# Patient Record
Sex: Male | Born: 2000 | Race: White | Hispanic: No | Marital: Single | Smoking: Never smoker
Health system: Southern US, Community
[De-identification: ages and names within clinical notes are randomized; demographics above are authoritative.]

---

## 2020-02-12 ENCOUNTER — Other Ambulatory Visit: Payer: Self-pay

## 2020-02-12 ENCOUNTER — Emergency Department (HOSPITAL_COMMUNITY): Payer: Medicaid Other | Admitting: Anesthesiology

## 2020-02-12 ENCOUNTER — Ambulatory Visit (HOSPITAL_COMMUNITY)
Admission: EM | Admit: 2020-02-12 | Discharge: 2020-02-12 | Disposition: A | Discharge status: Home / Self Care | Payer: Medicaid Other | Attending: Emergency Medicine | Admitting: Emergency Medicine

## 2020-02-12 ENCOUNTER — Emergency Department (HOSPITAL_COMMUNITY): Payer: Medicaid Other

## 2020-02-12 ENCOUNTER — Encounter (HOSPITAL_COMMUNITY)
Admission: EM | Disposition: A | Discharge status: Home / Self Care | Payer: Self-pay | Source: Home / Self Care | Attending: Emergency Medicine

## 2020-02-12 ENCOUNTER — Encounter (HOSPITAL_COMMUNITY): Payer: Self-pay | Admitting: Emergency Medicine

## 2020-02-12 DIAGNOSIS — K3580 Unspecified acute appendicitis: Secondary | ICD-10-CM | POA: Insufficient documentation

## 2020-02-12 DIAGNOSIS — F17290 Nicotine dependence, other tobacco product, uncomplicated: Secondary | ICD-10-CM | POA: Diagnosis not present

## 2020-02-12 DIAGNOSIS — Z20822 Contact with and (suspected) exposure to covid-19: Secondary | ICD-10-CM | POA: Insufficient documentation

## 2020-02-12 DIAGNOSIS — F1290 Cannabis use, unspecified, uncomplicated: Secondary | ICD-10-CM | POA: Diagnosis not present

## 2020-02-12 HISTORY — PX: LAPAROSCOPIC APPENDECTOMY: SHX408

## 2020-02-12 LAB — COMPREHENSIVE METABOLIC PANEL
ALT: 17 U/L (ref 0–44)
AST: 19 U/L (ref 15–41)
Albumin: 4.5 g/dL (ref 3.5–5.0)
Alkaline Phosphatase: 52 U/L (ref 38–126)
Anion gap: 10 (ref 5–15)
BUN: 19 mg/dL (ref 6–20)
CO2: 25 mmol/L (ref 22–32)
Calcium: 9.1 mg/dL (ref 8.9–10.3)
Chloride: 102 mmol/L (ref 98–111)
Creatinine, Ser: 1.27 mg/dL — ABNORMAL HIGH (ref 0.61–1.24)
GFR calc Af Amer: >60 mL/min (ref >60)
GFR calc non Af Amer: >60 mL/min (ref >60)
Glucose, Bld: 139 mg/dL — ABNORMAL HIGH (ref 70–99)
Potassium: 3.6 mmol/L (ref 3.5–5.1)
Sodium: 137 mmol/L (ref 135–145)
Total Bilirubin: 0.4 mg/dL (ref 0.3–1.2)
Total Protein: 6.6 g/dL (ref 6.5–8.1)

## 2020-02-12 LAB — CBC
HCT: 43 % (ref 39.0–52.0)
Hemoglobin: 14.4 g/dL (ref 13.0–17.0)
MCH: 32.3 pg (ref 26.0–34.0)
MCHC: 33.5 g/dL (ref 30.0–36.0)
MCV: 96.4 fL (ref 80.0–100.0)
Platelets: 168 10*3/uL (ref 150–400)
RBC: 4.46 MIL/uL (ref 4.22–5.81)
RDW: 11.6 % (ref 11.5–15.5)
WBC: 12.3 10*3/uL — ABNORMAL HIGH (ref 4.0–10.5)
nRBC: 0 % (ref 0.0–0.2)

## 2020-02-12 LAB — RESPIRATORY PANEL BY RT PCR (FLU A&B, COVID)
Influenza A by PCR: NEGATIVE
Influenza B by PCR: NEGATIVE
SARS Coronavirus 2 by RT PCR: NEGATIVE

## 2020-02-12 LAB — URINALYSIS, ROUTINE W REFLEX MICROSCOPIC
Bilirubin Urine: NEGATIVE
Glucose, UA: NEGATIVE mg/dL
Hgb urine dipstick: NEGATIVE
Ketones, ur: NEGATIVE mg/dL
Leukocytes,Ua: NEGATIVE
Nitrite: NEGATIVE
Protein, ur: NEGATIVE mg/dL
Specific Gravity, Urine: 1.015 (ref 1.005–1.030)
pH: 7 (ref 5.0–8.0)

## 2020-02-12 LAB — LIPASE, BLOOD: Lipase: 25 U/L (ref 11–51)

## 2020-02-12 SURGERY — APPENDECTOMY, LAPAROSCOPIC
Anesthesia: General | Site: Abdomen

## 2020-02-12 MED ORDER — IBUPROFEN 200 MG PO TABS: 400.0000 mg | ORAL_TABLET | Freq: Three times a day (TID) | ORAL | Status: AC | PRN

## 2020-02-12 MED ORDER — LIDOCAINE 2% (20 MG/ML) 5 ML SYRINGE
INTRAMUSCULAR | Status: AC
  Filled 2020-02-12: qty 10

## 2020-02-12 MED ORDER — OXYCODONE HCL 5 MG PO TABS: 5.0000 mg | ORAL_TABLET | Freq: Once | ORAL | Status: DC | PRN

## 2020-02-12 MED ORDER — ROCURONIUM BROMIDE 10 MG/ML (PF) SYRINGE
PREFILLED_SYRINGE | INTRAVENOUS | Status: DC | PRN
  Administered 2020-02-12: 20 mg via INTRAVENOUS
  Administered 2020-02-12: 30 mg via INTRAVENOUS

## 2020-02-12 MED ORDER — AMISULPRIDE (ANTIEMETIC) 5 MG/2ML IV SOLN
INTRAVENOUS | Status: AC
  Filled 2020-02-12: qty 4

## 2020-02-12 MED ORDER — ACETAMINOPHEN 500 MG PO TABS: 1000.0000 mg | ORAL_TABLET | Freq: Four times a day (QID) | ORAL | 0 refills | Status: AC

## 2020-02-12 MED ORDER — SODIUM CHLORIDE 0.9 % IR SOLN
Status: DC | PRN
  Administered 2020-02-12: 1000 mL

## 2020-02-12 MED ORDER — MIDAZOLAM HCL 2 MG/2ML IJ SOLN
INTRAMUSCULAR | Status: AC
  Filled 2020-02-12: qty 2

## 2020-02-12 MED ORDER — LIDOCAINE 2% (20 MG/ML) 5 ML SYRINGE
INTRAMUSCULAR | Status: DC | PRN
  Administered 2020-02-12: 60 mg via INTRAVENOUS

## 2020-02-12 MED ORDER — FENTANYL CITRATE (PF) 250 MCG/5ML IJ SOLN
INTRAMUSCULAR | Status: DC | PRN
Start: 2020-02-12 — End: 2020-02-12
  Administered 2020-02-12: 100 ug via INTRAVENOUS
  Administered 2020-02-12: 50 ug via INTRAVENOUS

## 2020-02-12 MED ORDER — IOHEXOL 300 MG/ML  SOLN
100.0000 mL | Freq: Once | INTRAMUSCULAR | Status: AC | PRN
  Administered 2020-02-12: 100 mL via INTRAVENOUS

## 2020-02-12 MED ORDER — PROPOFOL 10 MG/ML IV BOLUS
INTRAVENOUS | Status: AC
  Filled 2020-02-12: qty 20

## 2020-02-12 MED ORDER — FENTANYL CITRATE (PF) 100 MCG/2ML IJ SOLN: 25.0000 ug | INTRAMUSCULAR | Status: DC | PRN

## 2020-02-12 MED ORDER — METRONIDAZOLE IN NACL 5-0.79 MG/ML-% IV SOLN
500.0000 mg | Freq: Four times a day (QID) | INTRAVENOUS | Status: DC
  Administered 2020-02-12: 500 mg via INTRAVENOUS
  Filled 2020-02-12: qty 100

## 2020-02-12 MED ORDER — OXYCODONE HCL 5 MG PO TABS: 5.0000 mg | ORAL_TABLET | Freq: Four times a day (QID) | ORAL | 0 refills | Status: AC | PRN

## 2020-02-12 MED ORDER — PROMETHAZINE HCL 25 MG/ML IJ SOLN: 6.2500 mg | INTRAMUSCULAR | Status: DC | PRN

## 2020-02-12 MED ORDER — AMISULPRIDE (ANTIEMETIC) 5 MG/2ML IV SOLN
10.0000 mg | Freq: Once | INTRAVENOUS | Status: AC
  Administered 2020-02-12: 10 mg via INTRAVENOUS

## 2020-02-12 MED ORDER — FENTANYL CITRATE (PF) 250 MCG/5ML IJ SOLN
INTRAMUSCULAR | Status: AC
  Filled 2020-02-12: qty 5

## 2020-02-12 MED ORDER — SODIUM CHLORIDE 0.9 % IV SOLN
2.0000 g | INTRAVENOUS | Status: DC
  Administered 2020-02-12: 2 g via INTRAVENOUS
  Filled 2020-02-12: qty 20

## 2020-02-12 MED ORDER — ROCURONIUM BROMIDE 10 MG/ML (PF) SYRINGE
PREFILLED_SYRINGE | INTRAVENOUS | Status: AC
  Filled 2020-02-12: qty 10

## 2020-02-12 MED ORDER — 0.9 % SODIUM CHLORIDE (POUR BTL) OPTIME
TOPICAL | Status: DC | PRN
  Administered 2020-02-12: 1000 mL

## 2020-02-12 MED ORDER — ACETAMINOPHEN 500 MG PO TABS: 1000.0000 mg | ORAL_TABLET | Freq: Four times a day (QID) | ORAL | Status: DC

## 2020-02-12 MED ORDER — ONDANSETRON HCL 4 MG/2ML IJ SOLN
INTRAMUSCULAR | Status: DC | PRN
  Administered 2020-02-12: 4 mg via INTRAVENOUS

## 2020-02-12 MED ORDER — DEXMEDETOMIDINE (PRECEDEX) IN NS 20 MCG/5ML (4 MCG/ML) IV SYRINGE
PREFILLED_SYRINGE | INTRAVENOUS | Status: AC
  Filled 2020-02-12: qty 5

## 2020-02-12 MED ORDER — DEXAMETHASONE SODIUM PHOSPHATE 10 MG/ML IJ SOLN
INTRAMUSCULAR | Status: DC | PRN
  Administered 2020-02-12: 8 mg via INTRAVENOUS

## 2020-02-12 MED ORDER — LACTATED RINGERS IV SOLN: INTRAVENOUS | Status: DC

## 2020-02-12 MED ORDER — PHENYLEPHRINE 40 MCG/ML (10ML) SYRINGE FOR IV PUSH (FOR BLOOD PRESSURE SUPPORT)
PREFILLED_SYRINGE | INTRAVENOUS | Status: AC
  Filled 2020-02-12: qty 10

## 2020-02-12 MED ORDER — OXYCODONE HCL 5 MG/5ML PO SOLN: 5.0000 mg | Freq: Once | ORAL | Status: DC | PRN

## 2020-02-12 MED ORDER — MIDAZOLAM HCL 5 MG/5ML IJ SOLN
INTRAMUSCULAR | Status: DC | PRN
  Administered 2020-02-12: 2 mg via INTRAVENOUS

## 2020-02-12 MED ORDER — ONDANSETRON HCL 4 MG PO TABS: 4.0000 mg | ORAL_TABLET | Freq: Three times a day (TID) | ORAL | 0 refills | Status: AC | PRN

## 2020-02-12 MED ORDER — SUCCINYLCHOLINE CHLORIDE 20 MG/ML IJ SOLN
INTRAMUSCULAR | Status: DC | PRN
  Administered 2020-02-12: 120 mg via INTRAVENOUS

## 2020-02-12 MED ORDER — BUPIVACAINE HCL (PF) 0.25 % IJ SOLN
INTRAMUSCULAR | Status: AC
  Filled 2020-02-12: qty 30

## 2020-02-12 MED ORDER — BUPIVACAINE HCL (PF) 0.25 % IJ SOLN
INTRAMUSCULAR | Status: DC | PRN
  Administered 2020-02-12: 20 mL

## 2020-02-12 MED ORDER — KETOROLAC TROMETHAMINE 15 MG/ML IJ SOLN: 15.0000 mg | Freq: Once | INTRAMUSCULAR | Status: DC

## 2020-02-12 MED ORDER — SUGAMMADEX SODIUM 200 MG/2ML IV SOLN
INTRAVENOUS | Status: DC | PRN
  Administered 2020-02-12: 200 mg via INTRAVENOUS

## 2020-02-12 SURGICAL SUPPLY — 37 items
APPLIER CLIP 5 13 M/L LIGAMAX5 (MISCELLANEOUS)
APPLIER CLIP ROT 10 11.4 M/L (STAPLE)
CANISTER SUCT 3000ML PPV (MISCELLANEOUS) ×3 IMPLANT
CHLORAPREP W/TINT 26 (MISCELLANEOUS) ×3 IMPLANT
CLIP APPLIE 5 13 M/L LIGAMAX5 (MISCELLANEOUS) IMPLANT
CLIP APPLIE ROT 10 11.4 M/L (STAPLE) IMPLANT
COVER SURGICAL LIGHT HANDLE (MISCELLANEOUS) ×3 IMPLANT
COVER WAND RF STERILE (DRAPES) IMPLANT
CUTTER FLEX LINEAR 45M (STAPLE) ×3 IMPLANT
DERMABOND ADVANCED (GAUZE/BANDAGES/DRESSINGS) ×2
DERMABOND ADVANCED .7 DNX12 (GAUZE/BANDAGES/DRESSINGS) ×1 IMPLANT
ELECT REM PT RETURN 9FT ADLT (ELECTROSURGICAL) ×3
ELECTRODE REM PT RTRN 9FT ADLT (ELECTROSURGICAL) ×1 IMPLANT
GLOVE SURG SIGNA 7.5 PF LTX (GLOVE) ×3 IMPLANT
GOWN STRL REUS W/ TWL LRG LVL3 (GOWN DISPOSABLE) ×2 IMPLANT
GOWN STRL REUS W/ TWL XL LVL3 (GOWN DISPOSABLE) ×1 IMPLANT
GOWN STRL REUS W/TWL LRG LVL3 (GOWN DISPOSABLE) ×4
GOWN STRL REUS W/TWL XL LVL3 (GOWN DISPOSABLE) ×2
KIT BASIN OR (CUSTOM PROCEDURE TRAY) ×3 IMPLANT
KIT TURNOVER KIT B (KITS) ×3 IMPLANT
NS IRRIG 1000ML POUR BTL (IV SOLUTION) ×3 IMPLANT
PAD ARMBOARD 7.5X6 YLW CONV (MISCELLANEOUS) ×6 IMPLANT
POUCH SPECIMEN RETRIEVAL 10MM (ENDOMECHANICALS) ×3 IMPLANT
RELOAD 45 VASCULAR/THIN (ENDOMECHANICALS) IMPLANT
RELOAD STAPLE TA45 3.5 REG BLU (ENDOMECHANICALS) ×3 IMPLANT
SET IRRIG TUBING LAPAROSCOPIC (IRRIGATION / IRRIGATOR) ×3 IMPLANT
SET TUBE SMOKE EVAC HIGH FLOW (TUBING) ×3 IMPLANT
SHEARS HARMONIC ACE PLUS 36CM (ENDOMECHANICALS) ×3 IMPLANT
SLEEVE ENDOPATH XCEL 5M (ENDOMECHANICALS) ×3 IMPLANT
SPECIMEN JAR SMALL (MISCELLANEOUS) ×3 IMPLANT
SUT MON AB 4-0 PC3 18 (SUTURE) ×3 IMPLANT
TOWEL GREEN STERILE (TOWEL DISPOSABLE) ×3 IMPLANT
TOWEL GREEN STERILE FF (TOWEL DISPOSABLE) ×3 IMPLANT
TRAY LAPAROSCOPIC MC (CUSTOM PROCEDURE TRAY) ×3 IMPLANT
TROCAR XCEL BLUNT TIP 100MML (ENDOMECHANICALS) ×3 IMPLANT
TROCAR XCEL NON-BLD 5MMX100MML (ENDOMECHANICALS) ×3 IMPLANT
WATER STERILE IRR 1000ML POUR (IV SOLUTION) ×3 IMPLANT

## 2020-02-12 NOTE — ED Provider Notes (Signed)
[facility] EMERGENCY DEPARTMENT Provider Note   CSN: [id number] Arrival date & time: 02/12/20  0310     History Chief Complaint  Patient presents with  . Abdominal Pain    Corey Mcdowell is a 19 y.o. male with no known past medical history presents to the ER for evaluation of abdominal pain.  This began at around 1 AM this morning.  The pain initially was in the middle lower abdomen below the bellybutton but since has localized to the right lower quadrant.  He had subjective fevers and nausea this morning but this has resolved.  States when the pain began and on the drive to the ER the pain was much worse but it has slightly improved.  He took over-the-counter zinc.  He thought he had a little throat discomfort as well but this has resolved.  At first thought it was food poisoning. States he was talking to a friend who just had appendicitis and he is concerned about this.  He called his father who is a physician and told him to come to the ER.  He denies abdominal surgeries.  Denies history of kidney stones.  No back or flank pain.  No urinary symptoms.  No vomiting, diarrhea, constipation.  HPI     History reviewed. No pertinent past medical history.  There are no problems to display for this patient.   History reviewed. No pertinent surgical history.     No family history on file.  Social History   Tobacco Use  . Smoking status: Never Smoker  . Smokeless tobacco: Never Used  Substance Use Topics  . Alcohol use: Never  . Drug use: Never    Home Medications Prior to Admission medications   Not on File    Allergies    Patient has no known allergies.  Review of Systems   Review of Systems  Gastrointestinal: Positive for abdominal pain.  All other systems reviewed and are negative.   Physical Exam Updated Vital Signs BP 128/62   Pulse 66   Temp 98.2 F (36.8 C)   Resp 17   Ht 5\' 8"  (1.727 m)   Wt 80 kg   SpO2 96%   BMI 26.82 kg/m    Physical Exam Vitals and nursing note reviewed.  Constitutional:      Appearance: He is well-developed.     Comments: Non toxic.  HENT:     Head: Normocephalic and atraumatic.     Nose: Nose normal.  Eyes:     Conjunctiva/sclera: Conjunctivae normal.  Cardiovascular:     Rate and Rhythm: Normal rate and regular rhythm.     Heart sounds: Normal heart sounds.  Pulmonary:     Effort: Pulmonary effort is normal.     Breath sounds: Normal breath sounds.  Abdominal:     General: Bowel sounds are normal.     Palpations: Abdomen is soft.     Tenderness: There is abdominal tenderness in the right lower quadrant. Positive signs include McBurney's sign.     Comments: No G/R/R. No suprapubic or CVA tenderness. Negative Murphy's.   Musculoskeletal:        General: Normal range of motion.     Cervical back: Normal range of motion.  Skin:    General: Skin is warm and dry.     Capillary Refill: Capillary refill takes less than 2 seconds.  Neurological:     Mental Status: He is alert.  Psychiatric:        Behavior:  Behavior normal.     ED Results / Procedures / Treatments   Labs (all labs ordered are listed, but only abnormal results are displayed) Labs Reviewed  COMPREHENSIVE METABOLIC PANEL - Abnormal; Notable for the following components:      Result Value   Glucose, Bld 139 (*)    Creatinine, Ser 1.27 (*)    All other components within normal limits  CBC - Abnormal; Notable for the following components:   WBC 12.3 (*)    All other components within normal limits  RESPIRATORY PANEL BY RT PCR (FLU A&B, COVID)  LIPASE, BLOOD  URINALYSIS, ROUTINE W REFLEX MICROSCOPIC    EKG None  Radiology CT ABDOMEN PELVIS W CONTRAST  Result Date: 02/12/2020 CLINICAL DATA:  19 year old male with history of right lower quadrant abdominal pain EXAM: CT ABDOMEN AND PELVIS WITH CONTRAST TECHNIQUE: Multidetector CT imaging of the abdomen and pelvis was performed using the standard protocol  following bolus administration of intravenous contrast. CONTRAST:  OMNIPAQUE IOHEXOL 300 MG/ML  SOLN COMPARISON:  None. FINDINGS: Lower chest: No acute abnormality. Hepatobiliary: No focal liver abnormality is seen. No gallstones, gallbladder wall thickening, or biliary dilatation. Pancreas: Unremarkable. No pancreatic ductal dilatation or surrounding inflammatory changes. Spleen: Normal in size without focal abnormality. Adrenals/Urinary Tract: Adrenal glands are unremarkable. Kidneys are normal, without renal calculi, focal lesion, or hydronephrosis. Bladder is unremarkable. Stomach/Bowel: Stomach and small bowel are within normal limits. There is a dilated tubular structure in the right lower quadrant adjacent to the cecum which appears to be a fluid-filled distended appendix measuring up to 1 cm in diameter (best visualized on coronal images, series 6, image 66). No evidence of surrounding fluid collection or cecal inflammatory changes. Moderate stool burden within the colon. Vascular/Lymphatic: There are few prominent right lower quadrant lymph nodes, for example along the right iliac chain measuring up to 9 mm in short axis (series 3, image 53). No abdominopelvic vascular abnormality. Reproductive: Prostate is unremarkable. Other: No abdominal wall hernia or abnormality. No abdominopelvic ascites. Musculoskeletal: Osseous fragment along anteromedial aspect of the left lesser trochanter which is asymmetrically prominent compared to the right as could be seen with enthesopathy or prior psoas injury. There is a right intertrochanteric bone island. No acute osseous abnormality. IMPRESSION: Distended tubular structure in the right lower quadrant, favored to represent the appendix, however evaluation is limited due to lack of enteric contrast and body habitus. There is associated right lower quadrant iliac lymphadenopathy which is presumed reactive. These findings are highly suspicious for acute appendicitis.  If clinically indicated, consider ultrasound of the right lower quadrant for possible further characterization. These results were called by telephone at the time of interpretation on 02/12/2020 at 11:53 am to provider Clarke Amburn, PA-C and Dr. Lockie Mola, Who verbally acknowledged these results. Electronically Signed   By: Marliss Coots MD   On: 02/12/2020 11:59    Procedures Procedures (including critical care time)  Medications Ordered in ED Medications  iohexol (OMNIPAQUE) 300 MG/ML solution 100 mL (100 mLs Intravenous Contrast Given 02/12/20 1118)    ED Course  I have reviewed the triage vital signs and the nursing notes.  Pertinent labs & imaging results that were available during my care of the patient were reviewed by me and considered in my medical decision making (see chart for details).  Clinical Course as of Feb 11 1318  Thu Feb 12, 2020  0904 WBC(!): 12.3 [CG]    Clinical Course User Index [CG] Liberty Handy, PA-C  MDM Rules/Calculators/A&P                          Well-appearing 19 year old male presents with focal right lower quadrant abdominal pain.  Nausea, subjective fevers have resolved.  EMR, triage nursing notes reviewed to assist with obtaining history and MDM.  No records available.  ER work-up including CBC, CMP, lipase ordered in triage by triage RN.  These were personally visualized and interpreted.  ER work-up thus far reveals mild leukocytosis WBC 12.3, hyperglycemia glucose 139, creatinine 1.27.  Normal hemoglobin, LFTs, lipase and other electrolytes.  Differential diagnosis includes appendicitis, viral gastroenteritis, passing ureteral stone.  Less likely right-sided diverticulitis, SBO, UTI, pyelonephritis.  Patient does not appear to be in significant pain, he declined pain medicines.  I have ordered a urinalysis, CT A/P.  We will plan to repeat abdominal exam, pending imaging.  1200: CTAP personally visualized and interpreted with EDP.  Highly suggestive of appendicitis. Patient re-evaluated continues to have focal RLQ McBurney's point.  Fits clinical picture.   Discussed patient with general surgery PA Marisue Ivan who will see patient in ER, pending recommendations.   1320: No general surgery recommendations available on EMR. RE-evaluated patient states he tells me he was getting surgery.  COVID test ordered by surgery PA.  Final Clinical Impression(s) / ED Diagnoses Final diagnoses:  None    Rx / DC Orders ED Discharge Orders    None       Liberty Handy, PA-C 02/12/20 1320    Curatolo, Adam, DO 02/12/20 1342

## 2020-02-12 NOTE — Discharge Summary (Addendum)
[facility] Discharge Summary   Patient ID: Corey Mcdowell MRN: [id number] DOB/AGE: [birth year] 21 y.o.  Admit date: 02/12/2020 Discharge date: 02/12/2020  Admitting Diagnosis: Acute appendicitis   Discharge Diagnosis Acute appendicitis   Consultants None   Imaging: CT ABDOMEN PELVIS W CONTRAST  Result Date: 02/12/2020 CLINICAL DATA:  19 year old male with history of right lower quadrant abdominal pain EXAM: CT ABDOMEN AND PELVIS WITH CONTRAST TECHNIQUE: Multidetector CT imaging of the abdomen and pelvis was performed using the standard protocol following bolus administration of intravenous contrast. CONTRAST:  OMNIPAQUE IOHEXOL 300 MG/ML  SOLN COMPARISON:  None. FINDINGS: Lower chest: No acute abnormality. Hepatobiliary: No focal liver abnormality is seen. No gallstones, gallbladder wall thickening, or biliary dilatation. Pancreas: Unremarkable. No pancreatic ductal dilatation or surrounding inflammatory changes. Spleen: Normal in size without focal abnormality. Adrenals/Urinary Tract: Adrenal glands are unremarkable. Kidneys are normal, without renal calculi, focal lesion, or hydronephrosis. Bladder is unremarkable. Stomach/Bowel: Stomach and small bowel are within normal limits. There is a dilated tubular structure in the right lower quadrant adjacent to the cecum which appears to be a fluid-filled distended appendix measuring up to 1 cm in diameter (best visualized on coronal images, series 6, image 66). No evidence of surrounding fluid collection or cecal inflammatory changes. Moderate stool burden within the colon. Vascular/Lymphatic: There are few prominent right lower quadrant lymph nodes, for example along the right iliac chain measuring up to 9 mm in short axis (series 3, image 53). No abdominopelvic vascular abnormality. Reproductive: Prostate is unremarkable. Other: No abdominal wall hernia or abnormality. No abdominopelvic ascites. Musculoskeletal: Osseous fragment  along anteromedial aspect of the left lesser trochanter which is asymmetrically prominent compared to the right as could be seen with enthesopathy or prior psoas injury. There is a right intertrochanteric bone island. No acute osseous abnormality. IMPRESSION: Distended tubular structure in the right lower quadrant, favored to represent the appendix, however evaluation is limited due to lack of enteric contrast and body habitus. There is associated right lower quadrant iliac lymphadenopathy which is presumed reactive. These findings are highly suspicious for acute appendicitis. If clinically indicated, consider ultrasound of the right lower quadrant for possible further characterization. These results were called by telephone at the time of interpretation on 02/12/2020 at 11:53 am to provider CLAUDIA GIBBONS, PA-C and Dr. Lockie Mola, Who verbally acknowledged these results. Electronically Signed   By: Marliss Coots MD   On: 02/12/2020 11:59    Procedures Dr. Abigail Miyamoto- Laparoscopic Appendectomy 02/12/2020   Hospital Course:  Corey Mcdowell is a 19 y/o M/ who presented to the hospital with RLQ pain, nausea, and chills .  Workup showed WBC 12 and CT (above) consistent with appendicitis.  Patient was admitted and underwent procedure listed above.  Tolerated procedure well.  Diet was advanced as tolerated.  On POD#0, the patient was voiding well, tolerating diet, ambulating , pain controlled, vital signs stable, incisions c/d/i and felt stable for discharge home.  Patient will follow up in our office in 2 weeks and knows to call with questions or concerns.   Allergies as of 02/12/2020   No Known Allergies     Medication List    TAKE these medications   acetaminophen 500 MG tablet Commonly known as: TYLENOL Take 2 tablets (1,000 mg total) by mouth every 6 (six) hours.   ibuprofen 200 MG tablet Commonly known as: ADVIL Take 2-3 tablets (400-600 mg total) by mouth every 8 (eight) hours as needed for  mild pain or moderate  pain.   NyQuil Severe Cold/Flu 5-6.25-10-325 MG/15ML Liqd Generic drug: Phenyleph-Doxylamine-DM-APAP Take 30 mLs by mouth every 4 (four) hours as needed (cold symptoms).   ondansetron 4 MG tablet Commonly known as: Zofran Take 1 tablet (4 mg total) by mouth every 8 (eight) hours as needed for nausea or vomiting.   oxyCODONE 5 MG immediate release tablet Commonly known as: Oxy IR/ROXICODONE Take 1 tablet (5 mg total) by mouth every 6 (six) hours as needed for moderate pain or severe pain.   vitamin C 1000 MG tablet Take 500 mg by mouth daily.   zinc gluconate 50 MG tablet Take 50 mg by mouth daily.         Follow-up Information    [facility] Follow up.   Specialty: General Surgery Why: our office is scheduling you for post-operative follow up. please call to confirm appointment date/time.  Contact information: [address] [place] [address] [phone]              Signed: Hosie Spangle, [facility] 02/12/2020, 4:19 PM

## 2020-02-12 NOTE — ED Notes (Signed)
Pt resting comfortably in bed, denies pain when at rest.  Pt does not appear in distress, respirations are even and non-labored.  Skin is warm, dry and intact.   Denies further needs at this time, call light within reach.  Visitor at bedside.

## 2020-02-12 NOTE — Anesthesia Preprocedure Evaluation (Addendum)
Anesthesia Evaluation  Patient identified by MRN, date of birth, ID band Patient awake    Reviewed: Allergy & Precautions, NPO status , Patient's Chart, lab work & pertinent test results  History of Anesthesia Complications Negative for: history of anesthetic complications  Airway Mallampati: I  TM Distance: >3 FB Neck ROM: Full    Dental  (+) Dental Advisory Given, Teeth Intact   Pulmonary Current Smoker (vape) and Patient abstained from smoking.,    Pulmonary exam normal        Cardiovascular negative cardio ROS Normal cardiovascular exam     Neuro/Psych negative neurological ROS  negative psych ROS   GI/Hepatic (+)     substance abuse  marijuana use,  Acute appendicitis    Endo/Other  negative endocrine ROS  Renal/GU Renal InsufficiencyRenal disease     Musculoskeletal negative musculoskeletal ROS (+)   Abdominal   Peds  Hematology negative hematology ROS (+)   Anesthesia Other Findings Covid test negative   Reproductive/Obstetrics                           Anesthesia Physical Anesthesia Plan  ASA: II  Anesthesia Plan: General   Post-op Pain Management:    Induction: Intravenous and Rapid sequence  PONV Risk Score and Plan: 4 or greater and Treatment may vary due to age or medical condition, Ondansetron, Midazolam and Dexamethasone  Airway Management Planned: Oral ETT  Additional Equipment: None  Intra-op Plan:   Post-operative Plan: Extubation in OR  Informed Consent: I have reviewed the patients History and Physical, chart, labs and discussed the procedure including the risks, benefits and alternatives for the proposed anesthesia with the patient or authorized representative who has indicated his/her understanding and acceptance.     Dental advisory given  Plan Discussed with: CRNA and Anesthesiologist  Anesthesia Plan Comments:       Anesthesia Quick  Evaluation

## 2020-02-12 NOTE — H&P (Addendum)
[facility] Admission Note  Jacier Gladu 04-29-2001  [id number].    Requesting MD: Lockie Mola, MD Chief Complaint/Reason for Consult: appendicitis   HPI:  Mr. Kyser Wandel is a 19 y/o M with no significant PMH who presented to [facility] with a cc < 24h lower abdominal pain. He states that yesterday evening around 11PM he didn't feel well and thought he was getting a cold so he took an OTC medication like airborne and went to bed. At that time he had some abdominal discomfort but it was not severe. Reports waking up at 0130 with more severe RLQ pain associated with subjective fever, chills, and nausea but not vomiting. He spoke with his dad who is a physician and decided to come to the ED for evaluation. He reports some chills/body shakes in the car on the way to the hospital. He denies diarrhea or constipation - last BM was yesterday AM and was normal.    Social history: states he vapes daily, reports smoking marijuana occasionally (~monthly), denies IVDU, currently goes to school at [employer] and is a biochem major, his family is from [place].   NKDA. Denies daily medications.   ROS: Review of Systems  All other systems reviewed and are negative.  No family history on file.  History reviewed. No pertinent past medical history.  History reviewed. No pertinent surgical history.  Social History:  reports that he has never smoked. He has never used smokeless tobacco. He reports that he does not drink alcohol and does not use drugs.  Allergies: No Known Allergies  (Not in a hospital admission)   Blood pressure 121/64, pulse 61, temperature 98.2 F (36.8 C), resp. rate 17, height 5\' 8"  (1.727 m), weight 80 kg, SpO2 97 %. Physical Exam: Constitutional: NAD; conversant; no deformities Eyes: Moist conjunctiva; no lid lag; anicteric; PERRL Neck: Trachea midline; no thyromegaly Lungs: Normal respiratory effort; no tactile fremitus CV: RRR; no palpable thrills; no pitting  edema GI: Abd soft, mild focal RLQ tenderness without rebound or guarding, no palpable hepatosplenomegaly MSK: Normal gait; no clubbing/cyanosis Psychiatric: Appropriate affect; alert and oriented x3 Lymphatic: No palpable cervical or axillary lymphadenopathy  Results for orders placed or performed during the hospital encounter of 02/12/20 (from the past 48 hour(s))  Lipase, blood     Status: None   Collection Time: 02/12/20  3:27 AM  Result Value Ref Range   Lipase 25 11 - 51 U/L    Comment: Performed at [facility], [address], [place], [place]  Comprehensive metabolic panel     Status: Abnormal   Collection Time: 02/12/20  3:27 AM  Result Value Ref Range   Sodium 137 135 - 145 mmol/L   Potassium 3.6 3.5 - 5.1 mmol/L   Chloride 102 98 - 111 mmol/L   CO2 25 22 - 32 mmol/L   Glucose, Bld 139 (H) 70 - 99 mg/dL    Comment: Glucose reference range applies only to samples taken after fasting for at least 8 hours.   BUN 19 6 - 20 mg/dL   Creatinine, Ser 02/14/20 (H) 0.61 - 1.24 mg/dL   Calcium 9.1 8.9 - 1.91 mg/dL   Total Protein 6.6 6.5 - 8.1 g/dL   Albumin 4.5 3.5 - 5.0 g/dL   AST 19 15 - 41 U/L   ALT 17 0 - 44 U/L   Alkaline Phosphatase 52 38 - 126 U/L   Total Bilirubin 0.4 0.3 - 1.2 mg/dL   GFR calc  non Af Amer >60 >60 mL/min   GFR calc Af Amer >60 >60 mL/min   Anion gap 10 5 - 15    Comment: Performed at [facility], [address] [place], [place] [address]  CBC     Status: Abnormal   Collection Time: 02/12/20  3:27 AM  Result Value Ref Range   WBC 12.3 (H) 4.0 - 10.5 K/uL   RBC 4.46 4.22 - 5.81 MIL/uL   Hemoglobin 14.4 13.0 - 17.0 g/dL   HCT 84.1 39 - 52 %   MCV 96.4 80.0 - 100.0 fL   MCH 32.3 26.0 - 34.0 pg   MCHC 33.5 30.0 - 36.0 g/dL   RDW 32.4 40.1 - 02.7 %   Platelets 168 150 - 400 K/uL   nRBC 0.0 0.0 - 0.2 %    Comment: Performed at [facility], [address] [place], [place] [address]  Urinalysis, Routine w reflex microscopic      Status: None   Collection Time: 02/12/20 10:40 AM  Result Value Ref Range   Color, Urine YELLOW YELLOW   APPearance CLEAR CLEAR   Specific Gravity, Urine 1.015 1.005 - 1.030   pH 7.0 5.0 - 8.0   Glucose, UA NEGATIVE NEGATIVE mg/dL   Hgb urine dipstick NEGATIVE NEGATIVE   Bilirubin Urine NEGATIVE NEGATIVE   Ketones, ur NEGATIVE NEGATIVE mg/dL   Protein, ur NEGATIVE NEGATIVE mg/dL   Nitrite NEGATIVE NEGATIVE   Leukocytes,Ua NEGATIVE NEGATIVE    Comment: Performed at [facility], [address], [place], [place] [address]   CT ABDOMEN PELVIS W CONTRAST  Result Date: 02/12/2020 CLINICAL DATA:  19 year old male with history of right lower quadrant abdominal pain EXAM: CT ABDOMEN AND PELVIS WITH CONTRAST TECHNIQUE: Multidetector CT imaging of the abdomen and pelvis was performed using the standard protocol following bolus administration of intravenous contrast. CONTRAST:  OMNIPAQUE IOHEXOL 300 MG/ML  SOLN COMPARISON:  None. FINDINGS: Lower chest: No acute abnormality. Hepatobiliary: No focal liver abnormality is seen. No gallstones, gallbladder wall thickening, or biliary dilatation. Pancreas: Unremarkable. No pancreatic ductal dilatation or surrounding inflammatory changes. Spleen: Normal in size without focal abnormality. Adrenals/Urinary Tract: Adrenal glands are unremarkable. Kidneys are normal, without renal calculi, focal lesion, or hydronephrosis. Bladder is unremarkable. Stomach/Bowel: Stomach and small bowel are within normal limits. There is a dilated tubular structure in the right lower quadrant adjacent to the cecum which appears to be a fluid-filled distended appendix measuring up to 1 cm in diameter (best visualized on coronal images, series 6, image 66). No evidence of surrounding fluid collection or cecal inflammatory changes. Moderate stool burden within the colon. Vascular/Lymphatic: There are few prominent right lower quadrant lymph nodes, for example along the right  iliac chain measuring up to 9 mm in short axis (series 3, image 53). No abdominopelvic vascular abnormality. Reproductive: Prostate is unremarkable. Other: No abdominal wall hernia or abnormality. No abdominopelvic ascites. Musculoskeletal: Osseous fragment along anteromedial aspect of the left lesser trochanter which is asymmetrically prominent compared to the right as could be seen with enthesopathy or prior psoas injury. There is a right intertrochanteric bone island. No acute osseous abnormality. IMPRESSION: Distended tubular structure in the right lower quadrant, favored to represent the appendix, however evaluation is limited due to lack of enteric contrast and body habitus. There is associated right lower quadrant iliac lymphadenopathy which is presumed reactive. These findings are highly suspicious for acute appendicitis. If clinically indicated, consider ultrasound of the right  lower quadrant for possible further characterization. These results were called by telephone at the time of interpretation on 02/12/2020 at 11:53 am to provider CLAUDIA GIBBONS, PA-C and Dr. Lockie Mola, Who verbally acknowledged these results. Electronically Signed   By: Marliss Coots MD   On: 02/12/2020 11:59    Assessment/Plan AKI - suspect prerenal etiology, IVF @ 125 cc/hr, re-check BMP in AM  Acute appendicitis without evidence of perforation - NPO, IVF  - IV Rocephin/Flagyl  - COVID test pending - recommend laparoscopic appendectomy pending negative Covid test   Adam Phenix, [facility] Please see Amion for pager number during day hours 7:00am-4:30pm 02/12/2020, 12:58 PM

## 2020-02-12 NOTE — ED Triage Notes (Signed)
Patient reports right lower abdominal pain this evening , no emesis or diarrhea , denies fever or chills .

## 2020-02-12 NOTE — Anesthesia Procedure Notes (Signed)
Procedure Name: Intubation Date/Time: 02/12/2020 3:35 PM Performed by: Marena Chancy, CRNA Pre-anesthesia Checklist: Patient identified, Emergency Drugs available, Suction available and Patient being monitored Patient Re-evaluated:Patient Re-evaluated prior to induction Oxygen Delivery Method: Circle System Utilized Preoxygenation: Pre-oxygenation with 100% oxygen Induction Type: IV induction Ventilation: Mask ventilation without difficulty Laryngoscope Size: Miller and 2 Grade View: Grade I Tube type: Oral Tube size: 7.5 mm Number of attempts: 1 Airway Equipment and Method: Stylet and Oral airway Placement Confirmation: ETT inserted through vocal cords under direct vision,  positive ETCO2 and breath sounds checked- equal and bilateral Tube secured with: Tape Dental Injury: Teeth and Oropharynx as per pre-operative assessment

## 2020-02-12 NOTE — Op Note (Signed)
DATE OF PROCEDURE: 02/12/2020  PREOPERATIVE DIAGNOSIS: Acute appendicitis   POSTOPERATIVE DIAGNOSIS: Same   PROCEDURE PERFORMED: Laparoscopic appendectomy   SURGEONS: Abigail Miyamoto, MD  Louisa Second, MD (resident)   ANESTHESIA: General   INDICATIONS: The patient is a 19 y.o. Male presenting with acute appendicitis   PROCEDURE IN DETAIL:   The patient was seen again in the Holding Room. The risks, benefits, complications, treatment options, and expected outcomes were discussed with the patient and/or family. The possibilities of reaction to medication, perforation of viscus, bleeding, recurrent infection, finding a normal appendix, the need for additional procedures, failure to diagnose a condition, and creating a complication requiring transfusion or operation were discussed. There was concurrence with the proposed plan and informed consent was obtained. The site of surgery was properly noted. The patient was taken to Operating Room, identified as Mr. Corey Mcdowell and the procedure verified as laparoscopic appendectomy. A Time Out was held and the above information confirmed.  The patient was placed in the supine position and general anesthesia was induced.  The abdomen was prepped and draped in a sterile fashion. A one centimeter supraumbilical incision was made.  Dissection was carried down to the fascia bluntly.  The fascia was incised vertically.  We entered the peritoneal cavity bluntly.  A pursestring suture was passed around the incision with a 0 Vicryl.  The Hasson cannula was introduced into the abdomen and the tails of the suture were used to hold the Hasson in place.   The pneumoperitoneum was then established maintaining a maximum pressure of 15 mmHg.  Additional 5 mm cannulas then placed in the left lower quadrant of the abdomen and suprapubic region were placed under direct visualization. A careful evaluation of the entire abdomen was carried out. The patient was placed  in Trendelenburg and left lateral decubitus position.  The scope was moved to the right upper quadrant port site. The cecum was mobilized medially.  The appendix was retrocecal. It was mildly dilated, not perforated. The appendix was carefully dissected. The appendix was divided at its base using an endo-GIA stapler. Minimal appendiceal stump was left in place. There was no evidence of bleeding, leakage, or complication after division of the appendix. We then skeletonized the appendix with the harmonic scalpel. Irrigation was also performed and irrigate suctioned from the abdomen as well.  The umbilical port site was closed with the purse string suture. There was no residual palpable fascial defect.  The trocar site skin wounds were closed with 4-0 Monocryl.  Instrument, sponge, and needle counts were correct at the conclusion of the case.    ESTIMATED BLOOD LOSS: 5cc   SPECIMENS: Appendix   COMPLICATIONS: None

## 2020-02-12 NOTE — Transfer of Care (Addendum)
Immediate Anesthesia Transfer of Care Note  Patient: Corey Mcdowell  Procedure(s) Performed: APPENDECTOMY LAPAROSCOPIC (N/A Abdomen)  Patient Location: PACU  Anesthesia Type:General  Level of Consciousness: awake, alert  and oriented  Airway & Oxygen Therapy: Patient Spontanous Breathing and Patient connected to nasal cannula oxygen  Post-op Assessment: Report given to RN, Post -op Vital signs reviewed and stable and Patient moving all extremities X 4  Post vital signs: Reviewed and stable  Last Vitals:  Vitals Value Taken Time  BP 126/76 02/12/20 1628  Temp    Pulse 92 02/12/20 1630  Resp 15 02/12/20 1630  SpO2 100 % 02/12/20 1630  Vitals shown include unvalidated device data.  Last Pain:  Vitals:   02/12/20 0559  TempSrc: Oral  PainSc:          Complications: No complications documented.

## 2020-02-12 NOTE — ED Notes (Signed)
Pt ambulatory to restroom. Even, steady gait.  Does not appear in distress, respirations are even and non-labored

## 2020-02-12 NOTE — Discharge Instructions (Signed)
[facility] LAPAROSCOPIC SURGERY: POST OP INSTRUCTIONS Always review your discharge instruction sheet given to you by the facility where your surgery was performed. IF YOU HAVE DISABILITY OR FAMILY LEAVE FORMS, YOU MUST BRING THEM TO THE OFFICE FOR PROCESSING.   DO NOT GIVE THEM TO YOUR DOCTOR.  PAIN CONTROL  1. First take acetaminophen (Tylenol) AND/or ibuprofen (Advil) to control your pain after surgery.  Follow directions on package.  Taking acetaminophen (Tylenol) and/or ibuprofen (Advil) regularly after surgery will help to control your pain and lower the amount of prescription pain medication you may need.  You should not take more than 3,000 mg (3 grams) of acetaminophen (Tylenol) in 24 hours.  You should not take ibuprofen (Advil), aleve, motrin, naprosyn or other NSAIDS if you have a history of stomach ulcers or chronic kidney disease.  2. A prescription for pain medication may be given to you upon discharge.  Take your pain medication as prescribed, if you still have uncontrolled pain after taking acetaminophen (Tylenol) or ibuprofen (Advil). 3. Use ice packs to help control pain. 4. If you need a refill on your pain medication, please contact your pharmacy.  They will contact our office to request authorization. Prescriptions will not be filled after 5pm or on week-ends.  HOME MEDICATIONS 5. Take your usually prescribed medications unless otherwise directed.  DIET 6. You should follow a light diet the first few days after arrival home.  Be sure to include lots of fluids daily. Avoid fatty, fried foods.   CONSTIPATION 7. It is common to experience some constipation after surgery and if you are taking pain medication.  Increasing fluid intake and taking a stool softener (such as Colace) will usually help or prevent this problem from occurring.  A mild laxative (Milk of Magnesia or Miralax) should be taken according to package instructions if there are no bowel  movements after 48 hours.  WOUND/INCISION CARE 8. Most patients will experience some swelling and bruising in the area of the incisions.  Ice packs will help.  Swelling and bruising can take several days to resolve.  9. Unless discharge instructions indicate otherwise, follow guidelines below  a. STERI-STRIPS - you may remove your outer bandages 48 hours after surgery, and you may shower at that time.  You have steri-strips (small skin tapes) in place directly over the incision.  These strips should be left on the skin for 7-10 days.   b. DERMABOND/SKIN GLUE - you may shower in 24 hours.  The glue will flake off over the next 2-3 weeks. 10. Any sutures or staples will be removed at the office during your follow-up visit.  ACTIVITIES 11. You may resume regular (light) daily activities beginning the next day--such as daily self-care, walking, climbing stairs--gradually increasing activities as tolerated.  You may have sexual intercourse when it is comfortable.  Refrain from any heavy lifting or straining until approved by your doctor. a. You may drive when you are no longer taking prescription pain medication, you can comfortably wear a seatbelt, and you can safely maneuver your car and apply brakes.  FOLLOW-UP 12. You should see your doctor in the office for a follow-up appointment approximately 2-3 weeks after your surgery.  You should have been given your post-op/follow-up appointment when your surgery was scheduled.  If you did not receive a post-op/follow-up appointment, make sure that you call for this appointment within a day or two after you arrive home to insure a convenient appointment time.  WHEN   TO CALL YOUR DOCTOR: 1. Fever over 101.0 2. Inability to urinate 3. Continued bleeding from incision. 4. Increased pain, redness, or drainage from the incision. 5. Increasing abdominal pain  The clinic staff is available to answer your questions during regular business hours.  Please don't  hesitate to call and ask to speak to one of the nurses for clinical concerns.  If you have a medical emergency, go to the nearest emergency room or call 911.  A surgeon from [facility] is always on call at the hospital. [address], [address], [place], [place]  [address] ? [address] [address], [place], [place]   [address] [phone] ? [phone] ? FAX [phone] Web site: [email or web address]  

## 2020-02-13 ENCOUNTER — Encounter (HOSPITAL_COMMUNITY): Payer: Self-pay | Admitting: Surgery

## 2020-02-16 LAB — SURGICAL PATHOLOGY

## 2020-02-16 NOTE — Anesthesia Postprocedure Evaluation (Signed)
Anesthesia Post Note  Patient: Corey Mcdowell  Procedure(s) Performed: APPENDECTOMY LAPAROSCOPIC (N/A Abdomen)     Patient location during evaluation: PACU Anesthesia Type: General Level of consciousness: awake and alert Pain management: pain level controlled Vital Signs Assessment: post-procedure vital signs reviewed and stable Respiratory status: spontaneous breathing, nonlabored ventilation and respiratory function stable Cardiovascular status: blood pressure returned to baseline and stable Postop Assessment: no apparent nausea or vomiting Anesthetic complications: no   No complications documented.  Last Vitals:  Vitals:   02/12/20 1710 02/12/20 1715  BP: 130/61 130/61  Pulse: 66 67  Resp: 13 16  Temp:  (!) 36.3 C  SpO2: 100% 100%    Last Pain:  Vitals:   02/12/20 1655  TempSrc:   PainSc: Asleep                 Beryle Lathe

## 2021-07-22 IMAGING — CT CT ABD-PELV W/ CM
2 of 4 series · 15 of 46 positions shown, 17 images · IV contrast (Omni 300)
Comparison: None.

CLINICAL DATA: 19-year-old male with history of right lower
quadrant abdominal pain

EXAM:
CT ABDOMEN AND PELVIS WITH CONTRAST
TECHNIQUE: Multidetector CT imaging of the abdomen and pelvis was performed
using the standard protocol following bolus administration of
intravenous contrast.
CONTRAST:  100mL OMNIPAQUE IOHEXOL 300 MG/ML  SOLN

[Series 3: a/p w/ 5mm · axial · 0.88mm/px · z∈[+624,+1079]mm · 12 of 101 slices shown, 14 images]
[im 5/101  soft-tissue]
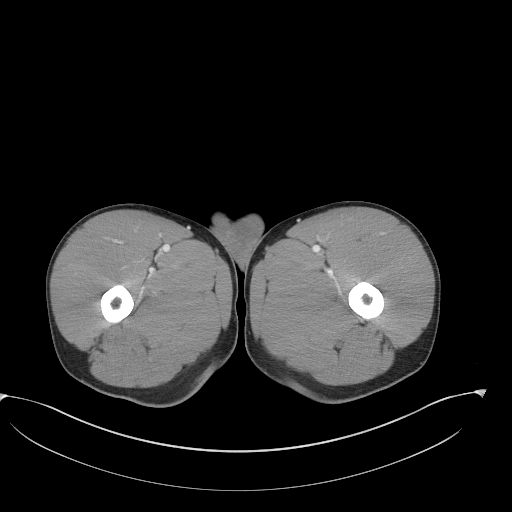
[im 5/101  bone]
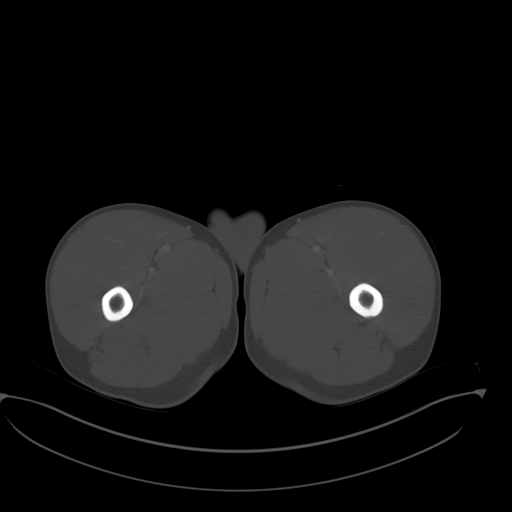
[im 14/101  soft-tissue]
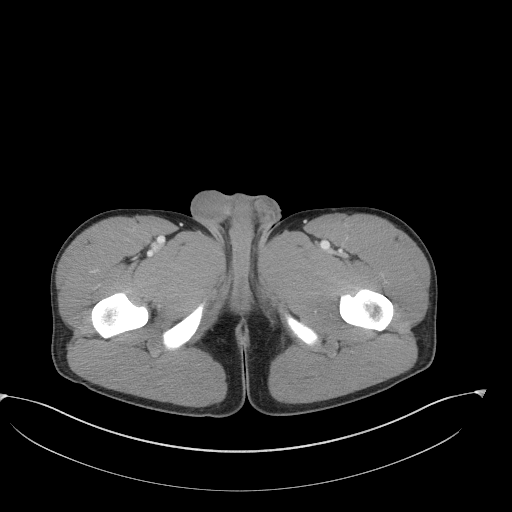
[im 22/101  soft-tissue]
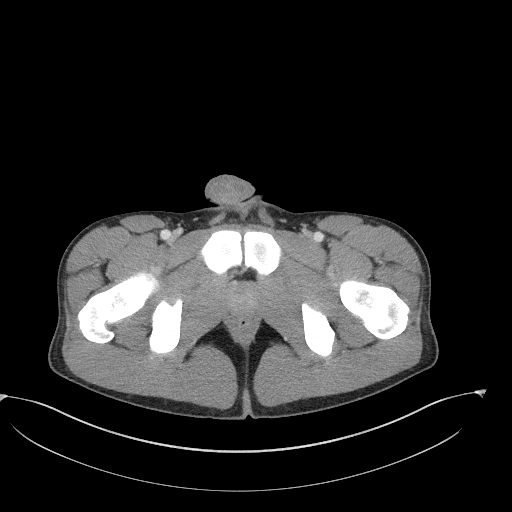
[im 31/101  soft-tissue]
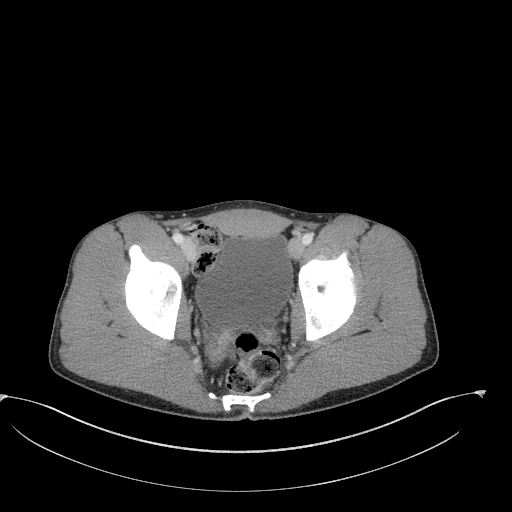
[im 40/101  soft-tissue]
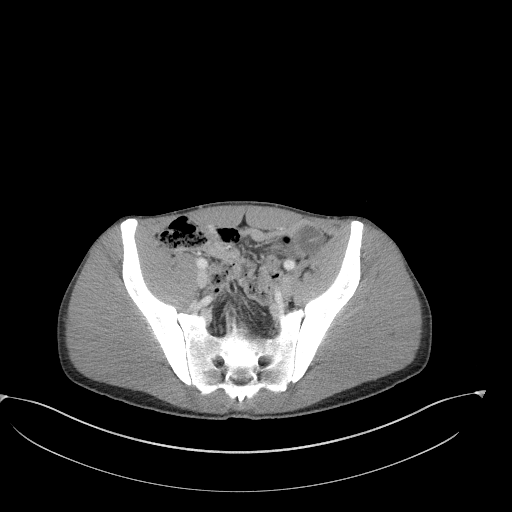
[im 48/101  soft-tissue]
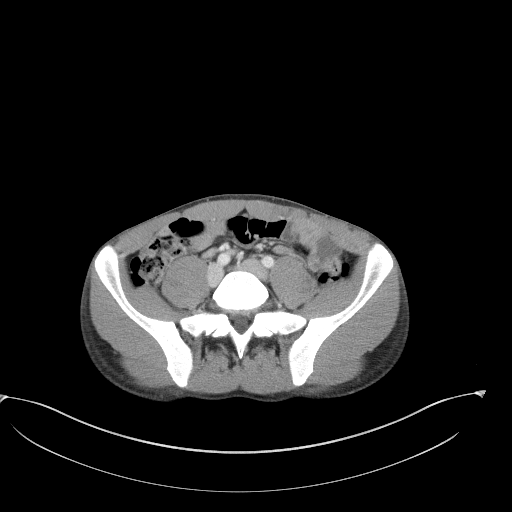
[im 53/101  soft-tissue]
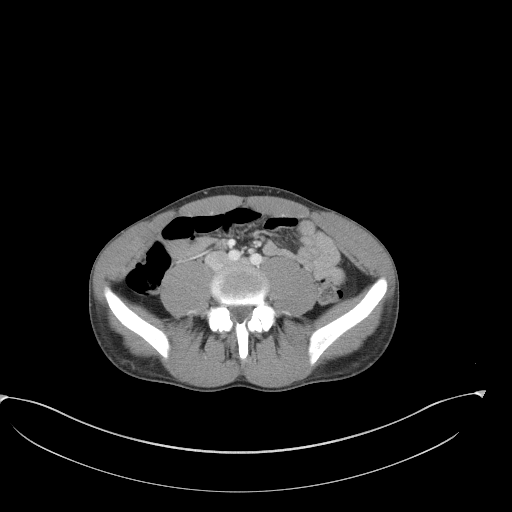
[im 61/101  soft-tissue]
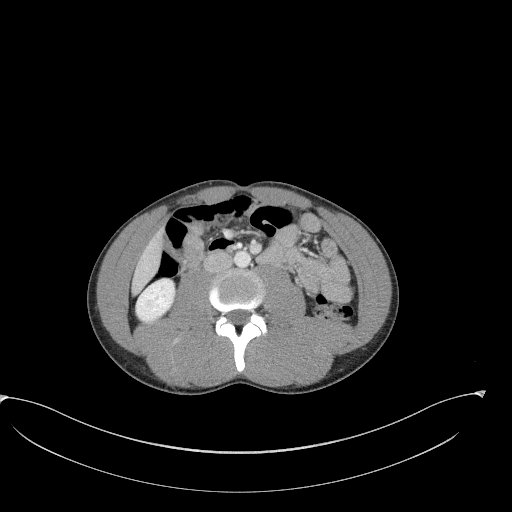
[im 70/101  soft-tissue]
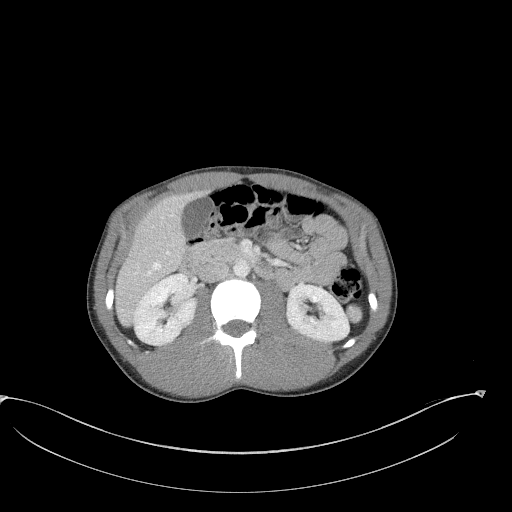
[im 70/101  bone]
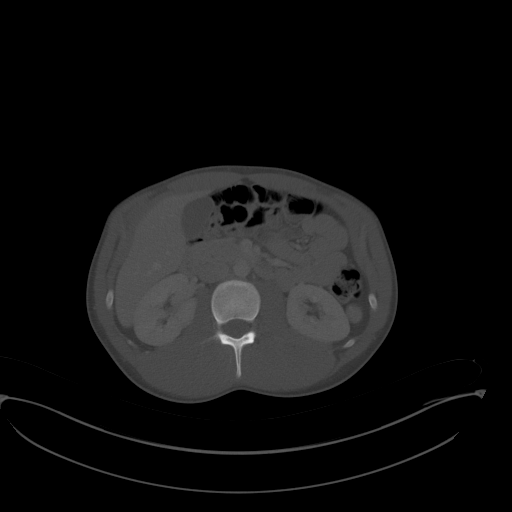
[im 79/101  soft-tissue]
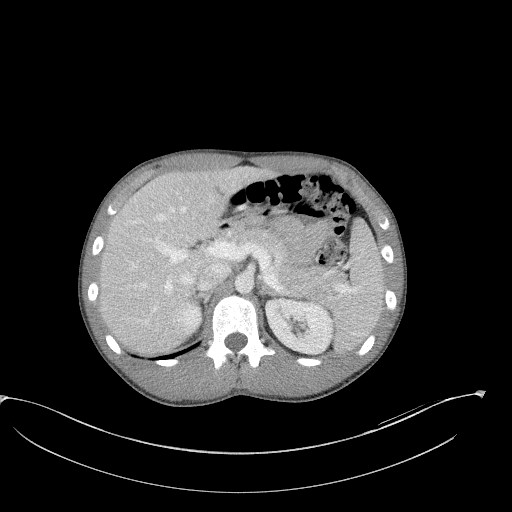
[im 87/101  soft-tissue]
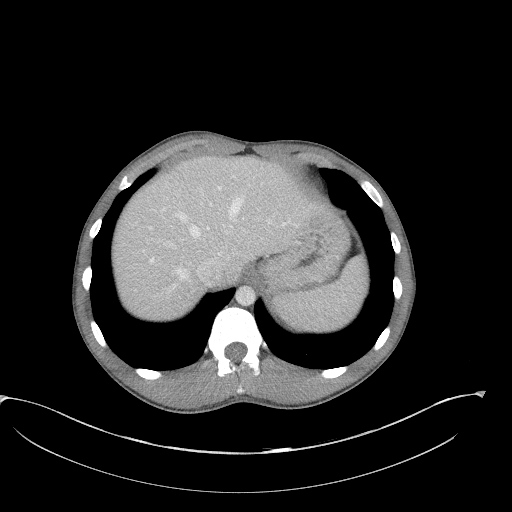
[im 96/101  soft-tissue]
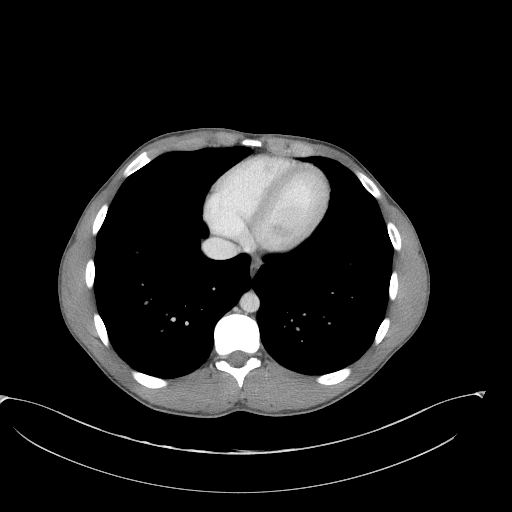

[Series 6: a/p w/ cor · coronal · 0.81mm/px · 3 of 148 slices shown]
[im 50/148  soft-tissue]
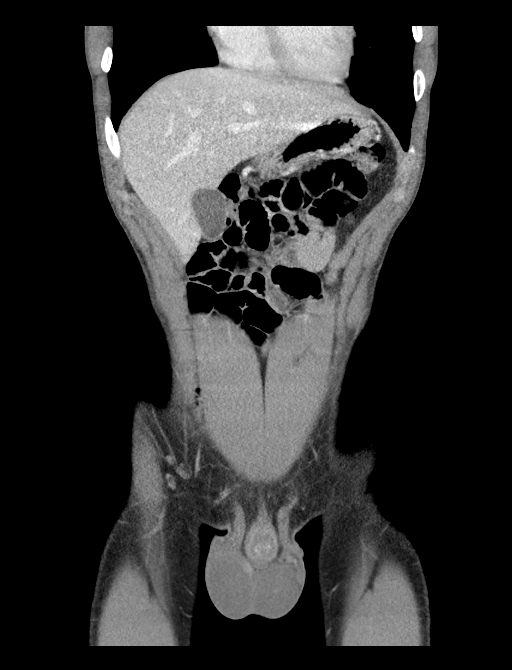
[im 66/148  soft-tissue]
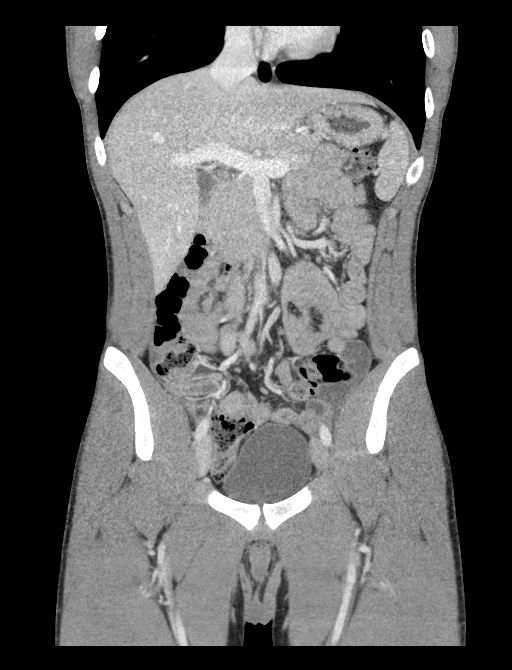
[im 82/148  soft-tissue]
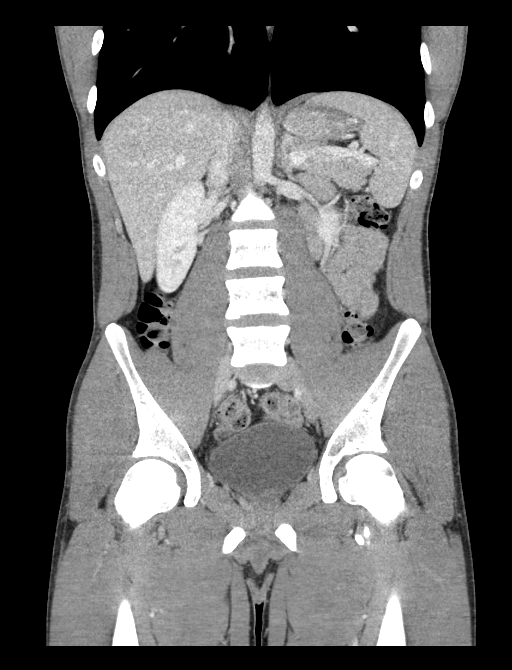

[15 of 46 positions shown; findings below may reference images not displayed]

FINDINGS: Lower chest: No acute abnormality.

Hepatobiliary: No focal liver abnormality is seen. No gallstones,
gallbladder wall thickening, or biliary dilatation.

Pancreas: Unremarkable. No pancreatic ductal dilatation or
surrounding inflammatory changes.

Spleen: Normal in size without focal abnormality.

Adrenals/Urinary Tract: Adrenal glands are unremarkable. Kidneys are
normal, without renal calculi, focal lesion, or hydronephrosis.
Bladder is unremarkable.

Stomach/Bowel: Stomach and small bowel are within normal limits.
There is a dilated tubular structure in the right lower quadrant
adjacent to the cecum which appears to be a fluid-filled distended
appendix measuring up to 1 cm in diameter (best visualized on
coronal images, series 6, image 66). No evidence of surrounding
fluid collection or cecal inflammatory changes. Moderate stool
burden within the colon.

Vascular/Lymphatic: There are few prominent right lower quadrant
lymph nodes, for example along the right iliac chain measuring up to
9 mm in short axis (series 3, image 53). No abdominopelvic vascular
abnormality.

Reproductive: Prostate is unremarkable.

Other: No abdominal wall hernia or abnormality. No abdominopelvic
ascites.

Musculoskeletal: Osseous fragment along anteromedial aspect of the
left lesser trochanter which is asymmetrically prominent compared to
the right as could be seen with enthesopathy or prior psoas injury.
There is a right intertrochanteric bone island. No acute osseous
abnormality.
IMPRESSION: Distended tubular structure in the right lower quadrant, favored to
represent the appendix, however evaluation is limited due to lack of
enteric contrast and body habitus. There is associated right lower
quadrant iliac lymphadenopathy which is presumed reactive. These
findings are highly suspicious for acute appendicitis. If clinically
indicated, consider ultrasound of the right lower quadrant for
possible further characterization.

These results were called by telephone at the time of interpretation
on 02/12/2020 at [DATE] to provider JOHNSEN, JERAMIAH and Dr.
Beverly, Who verbally acknowledged these results.
